# Patient Record
Sex: Female | Born: 2000 | Hispanic: No | Marital: Single | Smoking: Never smoker
Health system: Southern US, Community
[De-identification: ages and names within clinical notes are randomized; demographics above are authoritative.]

## PROBLEM LIST (undated history)

## (undated) DIAGNOSIS — F419 Anxiety disorder, unspecified: Secondary | ICD-10-CM

## (undated) DIAGNOSIS — L509 Urticaria, unspecified: Secondary | ICD-10-CM

## (undated) DIAGNOSIS — G589 Mononeuropathy, unspecified: Secondary | ICD-10-CM

## (undated) DIAGNOSIS — D649 Anemia, unspecified: Secondary | ICD-10-CM

## (undated) DIAGNOSIS — G43909 Migraine, unspecified, not intractable, without status migrainosus: Secondary | ICD-10-CM

## (undated) DIAGNOSIS — S149XXA Injury of unspecified nerves of neck, initial encounter: Secondary | ICD-10-CM

## (undated) DIAGNOSIS — J45909 Unspecified asthma, uncomplicated: Secondary | ICD-10-CM

## (undated) DIAGNOSIS — L309 Dermatitis, unspecified: Secondary | ICD-10-CM

## (undated) HISTORY — DX: Injury of unspecified nerves of neck, initial encounter: S14.9XXA

## (undated) HISTORY — DX: Urticaria, unspecified: L50.9

## (undated) HISTORY — DX: Migraine, unspecified, not intractable, without status migrainosus: G43.909

## (undated) HISTORY — DX: Dermatitis, unspecified: L30.9

## (undated) HISTORY — PX: TYMPANOSTOMY TUBE PLACEMENT: SHX32

## (undated) HISTORY — DX: Mononeuropathy, unspecified: G58.9

---
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
## 2021-03-27 LAB — OB RESULTS CONSOLE HEPATITIS B SURFACE ANTIGEN: Hepatitis B Surface Ag: NEGATIVE

## 2021-03-27 LAB — OB RESULTS CONSOLE ABO/RH: RH Type: POSITIVE

## 2021-03-27 LAB — HEPATITIS C ANTIBODY: HCV Ab: NEGATIVE

## 2021-03-27 LAB — OB RESULTS CONSOLE RUBELLA ANTIBODY, IGM: Rubella: IMMUNE

## 2021-03-27 LAB — OB RESULTS CONSOLE RPR: RPR: NONREACTIVE

## 2021-03-27 LAB — OB RESULTS CONSOLE HIV ANTIBODY (ROUTINE TESTING): HIV: NONREACTIVE

## 2021-05-15 ENCOUNTER — Emergency Department (HOSPITAL_BASED_OUTPATIENT_CLINIC_OR_DEPARTMENT_OTHER)
Admission: EM | Admit: 2021-05-15 | Discharge: 2021-05-15 | Disposition: A | Discharge status: Home / Self Care | Payer: Medicaid Other | Attending: Emergency Medicine | Admitting: Emergency Medicine

## 2021-05-15 ENCOUNTER — Other Ambulatory Visit: Payer: Self-pay

## 2021-05-15 ENCOUNTER — Encounter (HOSPITAL_BASED_OUTPATIENT_CLINIC_OR_DEPARTMENT_OTHER): Payer: Self-pay | Admitting: *Deleted

## 2021-05-15 DIAGNOSIS — O9A212 Injury, poisoning and certain other consequences of external causes complicating pregnancy, second trimester: Secondary | ICD-10-CM | POA: Diagnosis present

## 2021-05-15 DIAGNOSIS — S060X0A Concussion without loss of consciousness, initial encounter: Secondary | ICD-10-CM | POA: Diagnosis not present

## 2021-05-15 DIAGNOSIS — W208XXA Other cause of strike by thrown, projected or falling object, initial encounter: Secondary | ICD-10-CM | POA: Insufficient documentation

## 2021-05-15 DIAGNOSIS — Z5321 Procedure and treatment not carried out due to patient leaving prior to being seen by health care provider: Secondary | ICD-10-CM | POA: Diagnosis not present

## 2021-05-15 NOTE — ED Notes (Signed)
No answer for room x 1 

## 2021-05-15 NOTE — ED Notes (Signed)
Called for 3rd time; no answer; LWBS 

## 2021-05-15 NOTE — ED Triage Notes (Addendum)
C/o head injury from falling head board x 1 day ago. Denies LOC c/o h/a , Preg 18 weeks

## 2021-05-16 ENCOUNTER — Encounter: Payer: Self-pay | Admitting: Neurology

## 2021-07-22 NOTE — L&D Delivery Note (Signed)
Delivery Note ?Operative Delivery Note ?Patient noted to be 10/100/+2.  With pushing, deep repetitive decelerations occurred after each contraction.  Fetal heart rate baseline was also noted to increase from 140 to 170.  Patient was pushing with good effort.  She was consented for a vacuum delivery.  Fetal position was assessed.  Foley catheter was removed.  NICU team was alerted. Vacuum was applied at 1414.  Adequate suction could not be maintained.  New vacuum was requested and appropriate application was able to be achieved.   She pushed well over five contractions.  2 pop offs occurred but appropriate progress was made with each pull.  Vacuum was disengaged when fetal head was crowning.  The maternal tissue was slow to stretch, so patient was consented for a median episiotomy and baby delivered with next contraction.  At 1:35 PM a viable female was delivered via Vaginal, Vacuum Neurosurgeon).  Presentation:  Position: Occiput,, Posterior; Station: +3.  Patient immediately cried on maternal abdomen.  Delayed cord clamping and skin to skin was able to be performed ? ?Verbal consent: obtained from patient.  Risks and benefits discussed in detail.  Risks include, but are not limited to the risks of anesthesia, bleeding, infection, damage to maternal tissues, fetal cephalhematoma.  There is also the risk of inability to effect vaginal delivery of the head, or shoulder dystocia that cannot be resolved by established maneuvers, leading to the need for emergency cesarean section. ? ?APGAR: 9, 10; weight 3305 gm (7lb 4.6oz) .   ?Placenta status: spontaneous, in tact .   ?Cord: 3V with the following complications: None.  Cord pH: n/a ? ?Anesthesia:  Epidural, 1% lidocaine  ?Instruments: Kiwi Vacuum ?Episiotomy: Median ?Lacerations: Vaginal ?Suture Repair: 2.0 3.0 vicryl rapide ?Est. Blood Loss (mL): 200 ? ?Mom to postpartum.  Baby to Couplet care / Skin to Skin. ? ?Kesley Gaffey GEFFEL Londyn Hotard ?10/11/2021, 2:05 PM ? ? ? ? ? ?

## 2021-08-14 NOTE — Progress Notes (Signed)
NEUROLOGY CONSULTATION NOTE  Whitney Mcdonald MRN: [id number] DOB: 2001/02/06  Referring provider: Irene Pap, MD Primary care provider: Elwyn Reach, MD  Reason for consult:  migraines  Assessment/Plan:   Migraine without aura, without status migrainosus, not intractable Right sided occipital neuralgia History of multiple concussions Pregnant at [redacted] weeks gestation   1  She may start magnesium oxide 400mg  and riboflavin 400mg  daily.  She does plan to breastfeed, at which time we can start propranolol if needed. 2  For now, she may take acetaminophen if needed (Limit use of pain relievers to no more than 2 days out of week to prevent risk of rebound or medication-overuse headache).  Once her baby is born, we can try sumatriptan but she will need to hold nursing for 8 to 12 hours. 3.  Headache diary 4.  Follow up in 3 months.   Subjective:  Whitney Mcdonald is a 21 year old female who presents for migraines.  History supplemented by prior neurologist's and referring provider's notes.  History of migraines since 16 or 21 years old.  At that time, they were frequent.  They have improved since then.  They are a 7/10 throbbing/pounding to pressure pain in band like distribution or unilateral.  Sometimes associated with a shooting pain radiating up from the right occipital region.  Associated photophobia, phonophobia, visual disturbance (black spots) and dizziness but denies nausea, vomiting, unilateral numbness or weakness.  They typically last 2 hours but may last all day.  They occur 2 to 3 times a week.  If she sleeps in an awkward position, causing neck strain, she may have a severe migraine that following day.  Sleep helps relieve them.  Usually responds to Excedrin Migraine.  Previously on propranolol which was helpful.    She is currently [redacted] weeks pregnant.  No change in migraines.  Not taking any analgesics.    She has history of multiple head injuries since childhood.   She briefly lost consciousness twice.  Usually due to being clumsy (running backwards and falling, falling off a 7 foot wall).  On one occasion she had sustained some mild intracranial bleeding requiring no intervention.  She most recently had a concussion in October 2022 when a bed frame leaning against the wall fell on her head.  No LOC.  She was dizzy for a couple of days.   CT head on 08/09/2017 personally reviewed was normal.   Current NSAIDS/analgesics:  asa 81mg  Current triptans:  none Current ergotamine:  none Current anti-emetic:  none Current muscle relaxants:  none Current Antihypertensive medications:  none Current Antidepressant medications:  none Current Anticonvulsant medications:  none Current anti-CGRP:  none Current Vitamins/Herbal/Supplements:  prenatal Current Antihistamines/Decongestants:  Flonase Other therapy:  none Hormone/birth control:  none   Past NSAIDS/analgesics:  Excedrin Migraine Past abortive triptans:  none Past abortive ergotamine:  none Past muscle relaxants:  cyclobenzaprine Past anti-emetic:  none Past antihypertensive medications:  propranlol Past antidepressant medications:  none Past anticonvulsant medications:  gabapentin Past anti-CGRP:  none Past vitamins/Herbal/Supplements:  none Past antihistamines/decongestants:  Claritin, Flonase Other past therapies:  none  Family history:  Mom has migraines.   PAST MEDICAL HISTORY: Past Medical History:  Diagnosis Date   Eczema    Migraine    Pinched nerve in neck    occiputical   Urticaria     PAST SURGICAL HISTORY: Past Surgical History:  Procedure Laterality Date   TYMPANOSTOMY TUBE PLACEMENT      MEDICATIONS: Current Outpatient  Medications on File Prior to Visit  Medication Sig Dispense Refill   albuterol (PROAIR HFA) 108 (90 Base) MCG/ACT inhaler Inhale 2 puffs every 4 (four) hours as needed into the lungs for wheezing or shortness of breath. 1 Inhaler 2   cyclobenzaprine  (FLEXERIL) 5 MG tablet Take 1 tablet (5 mg total) by mouth every 8 (eight) hours as needed for muscle spasms. 30 tablet 5   EPINEPHrine (EPIPEN 2-PAK) 0.3 mg/0.3 mL IJ SOAJ injection Use as directed for severe allergic reaction 2 Device 1   fluticasone (FLONASE) 50 MCG/ACT nasal spray 2 sprays per nostril once a day if needed 16 g 5   gabapentin (NEURONTIN) 100 MG capsule Take 1 capsule (100 mg total) by mouth at bedtime. 30 capsule 5   levocetirizine (XYZAL) 5 MG tablet Take 1 tablet (5 mg total) by mouth every evening. 34 tablet 5   montelukast (SINGULAIR) 10 MG tablet TAKE 1 TABLET (10 MG TOTAL) AT BEDTIME BY MOUTH. 30 tablet 0   olopatadine (PATANOL) 0.1 % ophthalmic solution Place 1 drop into both eyes 2 (two) times daily. 5 mL 5   No current facility-administered medications on file prior to visit.    ALLERGIES: Allergies  Allergen Reactions   Cherry     Mouth itching   Strawberry Extract     FAMILY HISTORY: Family History  Problem Relation Age of Onset   Allergic rhinitis Mother    Asthma Mother    Sinusitis Mother    Depression Mother    Bipolar disorder Mother    Eczema Neg Hx    Immunodeficiency Neg Hx    Urticaria Neg Hx    Migraines Neg Hx    Seizures Neg Hx    Anxiety disorder Neg Hx    Schizophrenia Neg Hx    ADD / ADHD Neg Hx    Autism Neg Hx     Objective:  Blood pressure 103/64, pulse 84, weight 105 lb (47.6 kg), last menstrual period 01/06/2021, SpO2 100 %. General: No acute distress.  Patient appears well-groomed.   Head:  Normocephalic/atraumatic Eyes:  fundi examined but not visualized Neck: supple, no paraspinal tenderness, full range of motion Back: No paraspinal tenderness Heart: regular rate and rhythm Lungs: Clear to auscultation bilaterally. Vascular: No carotid bruits. Neurological Exam: Mental status: alert and oriented to person, place, and time, recent and remote memory intact, fund of knowledge intact, attention and concentration  intact, speech fluent and not dysarthric, language intact. Cranial nerves: CN I: not tested CN II: pupils equal, round and reactive to light, visual fields intact CN III, IV, VI:  full range of motion, no nystagmus, no ptosis CN V: facial sensation intact. CN VII: upper and lower face symmetric CN VIII: hearing intact CN IX, X: gag intact, uvula midline CN XI: sternocleidomastoid and trapezius muscles intact CN XII: tongue midline Bulk & Tone: normal, no fasciculations. Motor:  muscle strength 5/5 throughout Sensation:  Pinprick, temperature and vibratory sensation intact. Deep Tendon Reflexes:  2+ throughout,  toes downgoing.   Finger to nose testing:  Without dysmetria.   Heel to shin:  Without dysmetria.   Gait:  Normal station and stride.  Romberg negative.    Thank you for allowing me to take part in the care of this patient.  Metta Clines, DO  CC:  Irene Pap, MD  Elwyn Reach, MD

## 2021-08-15 ENCOUNTER — Encounter: Payer: Self-pay | Admitting: Neurology

## 2021-08-15 ENCOUNTER — Other Ambulatory Visit: Payer: Self-pay

## 2021-08-15 ENCOUNTER — Ambulatory Visit (INDEPENDENT_AMBULATORY_CARE_PROVIDER_SITE_OTHER): Payer: Medicaid Other | Admitting: Neurology

## 2021-08-15 VITALS — BP 103/64 | HR 84 | Wt 105.0 lb

## 2021-08-15 DIAGNOSIS — G43009 Migraine without aura, not intractable, without status migrainosus: Secondary | ICD-10-CM | POA: Diagnosis not present

## 2021-08-15 DIAGNOSIS — Z87820 Personal history of traumatic brain injury: Secondary | ICD-10-CM

## 2021-08-15 DIAGNOSIS — Z3A31 31 weeks gestation of pregnancy: Secondary | ICD-10-CM

## 2021-08-15 DIAGNOSIS — M5481 Occipital neuralgia: Secondary | ICD-10-CM | POA: Diagnosis not present

## 2021-08-15 NOTE — Patient Instructions (Signed)
Start magnesium oxide 400mg  daily and riboflavin (B2) 400mg  daily May take acetaminophen.  Limit use of pain relievers to no more than 2 days out of week to prevent risk of rebound or medication-overuse headache. Follow up in 3 months.

## 2021-09-10 ENCOUNTER — Encounter (HOSPITAL_COMMUNITY): Payer: Self-pay | Admitting: Obstetrics and Gynecology

## 2021-09-10 ENCOUNTER — Inpatient Hospital Stay (HOSPITAL_COMMUNITY)
Admission: AD | Admit: 2021-09-10 | Discharge: 2021-09-10 | Disposition: A | Discharge status: Home / Self Care | Payer: Medicaid Other | Attending: Obstetrics and Gynecology | Admitting: Obstetrics and Gynecology

## 2021-09-10 ENCOUNTER — Other Ambulatory Visit: Payer: Self-pay

## 2021-09-10 DIAGNOSIS — Z3A35 35 weeks gestation of pregnancy: Secondary | ICD-10-CM | POA: Diagnosis not present

## 2021-09-10 DIAGNOSIS — O4703 False labor before 37 completed weeks of gestation, third trimester: Secondary | ICD-10-CM | POA: Diagnosis not present

## 2021-09-10 DIAGNOSIS — O479 False labor, unspecified: Secondary | ICD-10-CM | POA: Diagnosis not present

## 2021-09-10 HISTORY — DX: Anemia, unspecified: D64.9

## 2021-09-10 HISTORY — DX: Anxiety disorder, unspecified: F41.9

## 2021-09-10 HISTORY — DX: Unspecified asthma, uncomplicated: J45.909

## 2021-09-10 LAB — URINALYSIS, ROUTINE W REFLEX MICROSCOPIC
Bilirubin Urine: NEGATIVE
Glucose, UA: NEGATIVE mg/dL
Hgb urine dipstick: NEGATIVE
Ketones, ur: NEGATIVE mg/dL
Nitrite: NEGATIVE
Protein, ur: NEGATIVE mg/dL
Specific Gravity, Urine: 1.006 (ref 1.005–1.030)
pH: 7 (ref 5.0–8.0)

## 2021-09-10 NOTE — MAU Note (Signed)
Whitney Mcdonald is a 21 y.o. at [redacted]w[redacted]d here in MAU reporting: has been having contractions for the past 12 hours. They occur about every 4 minutes. No LOF. No bleeding. +FM.  Onset of complaint: 12 hours  Pain score: 6/10  Vitals:   09/10/21 1121  BP: 115/76  Pulse: 84  Resp: 16  Temp: 97.8 F (36.6 C)  SpO2: 97%     FHT:137  Lab orders placed from triage: UA

## 2021-09-10 NOTE — MAU Provider Note (Signed)
History     CSN: [id number]  Arrival date and time: 09/10/21 1109   Event Date/Time   First Provider Initiated Contact with Patient 09/10/21 1204      Chief Complaint  Patient presents with   Contractions   HPI  Whitney Mcdonald. Is a 21 y.o. female G1P0 @ [redacted]w[redacted]d here in MAU with complaints of contractions. The contractions started 12 hours ago following intercourse. The contractions come and go and are irregular. She has no bleeding or leaking of fluid. + FM.   OB History     Gravida  1   Para      Term      Preterm      AB      Living         SAB      IAB      Ectopic      Multiple      Live Births              Past Medical History:  Diagnosis Date   Anemia    Anxiety    Asthma    exercise induced, pretty much stopped when stopped sports   Eczema    Migraine    Pinched nerve in neck    occiputical   Urticaria     Past Surgical History:  Procedure Laterality Date   TYMPANOSTOMY TUBE PLACEMENT      Family History  Problem Relation Age of Onset   Diabetes Mother    Heart disease Mother        murmur   Allergic rhinitis Mother    Asthma Mother    Sinusitis Mother    Depression Mother    Bipolar disorder Mother    Healthy Father    Eczema Neg Hx    Immunodeficiency Neg Hx    Urticaria Neg Hx    Migraines Neg Hx    Seizures Neg Hx    Anxiety disorder Neg Hx    Schizophrenia Neg Hx    ADD / ADHD Neg Hx    Autism Neg Hx     Social History   Tobacco Use   Smoking status: Never   Smokeless tobacco: Never   Tobacco comments:    E cig stopped 7/22  Vaping Use   Vaping Use: Former  Substance Use Topics   Alcohol use: No   Drug use: No    Allergies:  Allergies  Allergen Reactions   Cherry     Mouth itching   Strawberry Extract     Used to be a rash, rec'd allergy shots    Medications Prior to Admission  Medication Sig Dispense Refill Last Dose   BABY ASPIRIN PO Take by mouth.   Past Week   Prenatal Vit-Fe  Fumarate-FA (PRENATAL COMPLETE PO) Take by mouth.   09/09/2021   albuterol (PROAIR HFA) 108 (90 Base) MCG/ACT inhaler Inhale 2 puffs every 4 (four) hours as needed into the lungs for wheezing or shortness of breath. (Patient not taking: Reported on 08/15/2021) 1 Inhaler 2    EPINEPHrine (EPIPEN 2-PAK) 0.3 mg/0.3 mL IJ SOAJ injection Use as directed for severe allergic reaction 2 Device 1    fluticasone (FLONASE) 50 MCG/ACT nasal spray 2 sprays per nostril once a day if needed 16 g 5    montelukast (SINGULAIR) 10 MG tablet TAKE 1 TABLET (10 MG TOTAL) AT BEDTIME BY MOUTH. 30 tablet 0    Results for orders placed or performed during the hospital  encounter of 09/10/21 (from the past 48 hour(s))  Urinalysis, Routine w reflex microscopic Urine, Clean Catch     Status: Abnormal   Collection Time: 09/10/21 11:15 AM  Result Value Ref Range   Color, Urine YELLOW YELLOW   APPearance HAZY (A) CLEAR   Specific Gravity, Urine 1.006 1.005 - 1.030   pH 7.0 5.0 - 8.0   Glucose, UA NEGATIVE NEGATIVE mg/dL   Hgb urine dipstick NEGATIVE NEGATIVE   Bilirubin Urine NEGATIVE NEGATIVE   Ketones, ur NEGATIVE NEGATIVE mg/dL   Protein, ur NEGATIVE NEGATIVE mg/dL   Nitrite NEGATIVE NEGATIVE   Leukocytes,Ua LARGE (A) NEGATIVE   RBC / HPF 6-10 0 - 5 RBC/hpf   WBC, UA 11-20 0 - 5 WBC/hpf   Bacteria, UA RARE (A) NONE SEEN   Squamous Epithelial / LPF 11-20 0 - 5   Mucus PRESENT     Comment: Performed at [facility], [address], [place], [place] [address]     Review of Systems  Constitutional:  Negative for fever.  Gastrointestinal:  Positive for abdominal pain.  Genitourinary:  Negative for dysuria, flank pain, vaginal bleeding and vaginal discharge.  Physical Exam   Blood pressure 115/76, pulse 84, temperature 97.8 F (36.6 C), temperature source Oral, resp. rate 16, height 5\' 2"  (1.575 m), weight 49.4 kg, last menstrual period 01/06/2021, SpO2 97 %.  Physical Exam Vitals and nursing note reviewed.   Constitutional:      Appearance: Normal appearance.  HENT:     Head: Normocephalic.  Abdominal:     Palpations: Abdomen is soft.     Tenderness: There is no abdominal tenderness.  Genitourinary:    Comments: Dilation: Closed Effacement (%): 50 Station: -3 Exam by:: J Yoshino Broccoli NP  Skin:    General: Skin is warm.  Neurological:     Mental Status: She is alert and oriented to person, place, and time.  Psychiatric:        Mood and Affect: Mood normal.   Fetal Tracing: Baseline: 125 bpm Variability: Moderate  Accelerations: 15x15 Decelerations: None Toco:  UI  MAU Course  Procedures None  MDM  Oral hydration in MAU. Patient feeling much better.   Assessment and Plan   A:  1. Braxton Hicks contractions   2. [redacted] weeks gestation of pregnancy      P:  Discharge home Return to MAU if symptoms worsen Labor precautions  Esias Mory, [phone], NP 09/10/2021 5:23 PM

## 2021-09-26 LAB — OB RESULTS CONSOLE GC/CHLAMYDIA
Chlamydia: NEGATIVE
Gonorrhea: NEGATIVE

## 2021-09-26 LAB — OB RESULTS CONSOLE GBS: GBS: POSITIVE

## 2021-10-08 ENCOUNTER — Other Ambulatory Visit: Payer: Self-pay

## 2021-10-08 ENCOUNTER — Inpatient Hospital Stay (HOSPITAL_COMMUNITY)
Admission: AD | Admit: 2021-10-08 | Discharge: 2021-10-08 | Disposition: A | Discharge status: Home / Self Care | Payer: Medicaid Other | Attending: Obstetrics and Gynecology | Admitting: Obstetrics and Gynecology

## 2021-10-08 ENCOUNTER — Encounter (HOSPITAL_COMMUNITY): Payer: Self-pay | Admitting: Obstetrics and Gynecology

## 2021-10-08 DIAGNOSIS — O368130 Decreased fetal movements, third trimester, not applicable or unspecified: Secondary | ICD-10-CM | POA: Diagnosis present

## 2021-10-08 DIAGNOSIS — O471 False labor at or after 37 completed weeks of gestation: Secondary | ICD-10-CM | POA: Insufficient documentation

## 2021-10-08 DIAGNOSIS — Z3A39 39 weeks gestation of pregnancy: Secondary | ICD-10-CM | POA: Diagnosis not present

## 2021-10-08 NOTE — MAU Note (Signed)
Whitney Mcdonald is a 21 y.o. at [redacted]w[redacted]d here in MAU reporting: called her OB, mucous plug came out yesterday, more today. Has been feeling decreased fetal movement since the plug came out yesterday. Has been cramping, a "bad one every 30 min to an hour" ? ?Onset of complaint: yesterday ?Pain score: 7 ?Vitals:  ? 10/08/21 1804  ?BP: 120/60  ?Pulse: (!) 104  ?Resp: 16  ?Temp: 98.3 ?F (36.8 ?C)  ?SpO2: 100%  ?   ?FHT:158, movement seen by nurse and pt while listening in triage ?Lab orders placed from triage:  none ?

## 2021-10-08 NOTE — MAU Provider Note (Signed)
?History  ?  ? ?CSN: [id number] ? ?Arrival date and time: 10/08/21 1744 ? ? Event Date/Time  ? First Provider Initiated Contact with Patient 10/08/21 1833   ?  ? ?Chief Complaint  ?Patient presents with  ? Decreased Fetal Movement  ? Contractions  ? ?HPI ?This is a 21 year old G1 at 39 weeks and 2 days who presents with occasional contractions that started yesterday and has continued today.  She reports losing her mucous plug yesterday and since then has had some decreased fetal movement.  She is still feeling the baby move although the movements have not been as much.  Denies vaginal bleeding, leaking fluid. ? ?OB History   ? ? Gravida  ?1  ? Para  ?   ? Term  ?   ? Preterm  ?   ? AB  ?   ? Living  ?   ?  ? ? SAB  ?   ? IAB  ?   ? Ectopic  ?   ? Multiple  ?   ? Live Births  ?   ?   ?  ?  ? ? ?Past Medical History:  ?Diagnosis Date  ? Anemia   ? Anxiety   ? Asthma   ? exercise induced, pretty much stopped when stopped sports  ? Eczema   ? Migraine   ? Pinched nerve in neck   ? occiputical  ? Urticaria   ? ? ?Past Surgical History:  ?Procedure Laterality Date  ? TYMPANOSTOMY TUBE PLACEMENT    ? ? ?Family History  ?Problem Relation Age of Onset  ? Diabetes Mother   ? Heart disease Mother   ?     murmur  ? Allergic rhinitis Mother   ? Asthma Mother   ? Sinusitis Mother   ? Depression Mother   ? Bipolar disorder Mother   ? Healthy Father   ? Eczema Neg Hx   ? Immunodeficiency Neg Hx   ? Urticaria Neg Hx   ? Migraines Neg Hx   ? Seizures Neg Hx   ? Anxiety disorder Neg Hx   ? Schizophrenia Neg Hx   ? ADD / ADHD Neg Hx   ? Autism Neg Hx   ? ? ?Social History  ? ?Tobacco Use  ? Smoking status: Never  ? Smokeless tobacco: Never  ? Tobacco comments:  ?  E cig stopped 7/22  ?Vaping Use  ? Vaping Use: Never used  ?Substance Use Topics  ? Alcohol use: No  ? Drug use: No  ? ? ?Allergies:  ?Allergies  ?Allergen Reactions  ? Cherry   ?  Mouth itching  ? Strawberry Extract   ?  Used to be a rash, rec'd allergy shots   ? ? ?Medications Prior to Admission  ?Medication Sig Dispense Refill Last Dose  ? BABY ASPIRIN PO Take by mouth.   10/07/2021  ? Prenatal Vit-Fe Fumarate-FA (PRENATAL COMPLETE PO) Take by mouth.   10/08/2021  ? albuterol (PROAIR HFA) 108 (90 Base) MCG/ACT inhaler Inhale 2 puffs every 4 (four) hours as needed into the lungs for wheezing or shortness of breath. (Patient not taking: Reported on 08/15/2021) 1 Inhaler 2   ? EPINEPHrine (EPIPEN 2-PAK) 0.3 mg/0.3 mL IJ SOAJ injection Use as directed for severe allergic reaction 2 Device 1 More than a month  ? fluticasone (FLONASE) 50 MCG/ACT nasal spray 2 sprays per nostril once a day if needed 16 g 5 More than a month  ? montelukast (SINGULAIR) 10  MG tablet TAKE 1 TABLET (10 MG TOTAL) AT BEDTIME BY MOUTH. 30 tablet 0 More than a month  ? ? ?Review of Systems ?Physical Exam  ? ?Blood pressure 120/60, pulse (!) 104, temperature 98.3 ?F (36.8 ?C), temperature source Oral, resp. rate 16, height 5\' 2"  (1.575 m), weight 50.6 kg, last menstrual period 01/06/2021, SpO2 100 %. ? ?Physical Exam ?Vitals reviewed.  ?Constitutional:   ?   Appearance: Normal appearance.  ?Abdominal:  ?   General: Abdomen is flat.  ?   Palpations: Abdomen is soft.  ?Neurological:  ?   Mental Status: She is alert.  ?Psychiatric:     ?   Mood and Affect: Mood normal.     ?   Behavior: Behavior normal.     ?   Thought Content: Thought content normal.     ?   Judgment: Judgment normal.  ? ? ?MAU Course  ?Procedures ?NST:  ?Baseline: 130  ?Variability: moderate ?Accelerations: ++   ?Decelerations: none ?Contractions: occasional ? ?MDM ? ? ?Assessment and Plan  ?1. [redacted] weeks gestation of pregnancy ? ?2. Decreased fetal movements in third trimester, single or unspecified fetus ?Since the patient has been here, the patient has experienced several fetal movements.  Strip is category 1 and reactive.  Patient discussed with Dr. 01/08/2021 who will get the patient into the office tomorrow for reevaluation. ? ? ?Mindi Slicker ?10/08/2021, 7:53 PM  ?

## 2021-10-10 ENCOUNTER — Inpatient Hospital Stay (EMERGENCY_DEPARTMENT_HOSPITAL)
Admission: AD | Admit: 2021-10-10 | Discharge: 2021-10-10 | Disposition: A | Discharge status: Home / Self Care | Payer: Medicaid Other | Source: Home / Self Care | Attending: Obstetrics and Gynecology | Admitting: Obstetrics and Gynecology

## 2021-10-10 ENCOUNTER — Telehealth (HOSPITAL_COMMUNITY): Payer: Self-pay | Admitting: *Deleted

## 2021-10-10 ENCOUNTER — Encounter (HOSPITAL_COMMUNITY): Payer: Self-pay | Admitting: Obstetrics and Gynecology

## 2021-10-10 DIAGNOSIS — Z3A39 39 weeks gestation of pregnancy: Secondary | ICD-10-CM | POA: Insufficient documentation

## 2021-10-10 DIAGNOSIS — O471 False labor at or after 37 completed weeks of gestation: Secondary | ICD-10-CM | POA: Insufficient documentation

## 2021-10-10 DIAGNOSIS — Z3689 Encounter for other specified antenatal screening: Secondary | ICD-10-CM

## 2021-10-10 DIAGNOSIS — O26893 Other specified pregnancy related conditions, third trimester: Secondary | ICD-10-CM | POA: Insufficient documentation

## 2021-10-10 DIAGNOSIS — O479 False labor, unspecified: Secondary | ICD-10-CM | POA: Diagnosis not present

## 2021-10-10 NOTE — MAU Note (Signed)
Whitney Mcdonald is a 21 y.o. at [redacted]w[redacted]d here in MAU reporting: contractions started last night, was able to get a few hours of sleep, now every .  Was 2/70 yesterday- had membrane sweep. No leaking, small amt of bleeding after sweep.  Reports +FM ? ?Onset of complaint: 2000 ?Pain score: 9 ?Vitals:  ? 10/10/21 0920  ?BP: 114/70  ?Pulse: 77  ?Resp: 16  ?Temp: 97.9 ?F (36.6 ?C)  ?SpO2: 100%  ?   ?Lab orders placed from triage:  none ?

## 2021-10-10 NOTE — Telephone Encounter (Signed)
Preadmission screen  

## 2021-10-10 NOTE — MAU Provider Note (Signed)
S: Patient is here for RN labor evaluation. Fetal tracing, vital signs, & chart reviewed  ? ?O:  ?Vitals:  ? 10/10/21 1105 10/10/21 1110 10/10/21 1115 10/10/21 1117  ?BP:    128/75  ?Pulse:    70  ?Resp:    16  ?Temp:    97.8 ?F (36.6 ?C)  ?TempSrc:    Oral  ?SpO2: 99% 100% 99%   ?Weight:      ?Height:      ? ?No results found for this or any previous visit (from the past 24 hour(s)). ? ?Dilation: 2 ?Effacement (%): 80 ?Cervical Position: Posterior ?Station: -1 ?Presentation: Vertex ?Exam by:: E.Edwards,RN ? ? ?FHR: 120 bpm, Mod Var, No Decels, 15 x 15 Accels ?UC: q 1-5 min ? ? ?A: ?--21 y.o. G1P0 at [redacted]w[redacted]d  for RN labor check ?--Reactive NST ?--Cervix unchanged after 90 min in MAU ? ? ?P:  ?RN to discharge home in stable condition with return precautions & fetal kick counts ? ?Darlina Rumpf, CNM ?10/10/21 ?1:29 PM ? ?

## 2021-10-11 ENCOUNTER — Inpatient Hospital Stay (HOSPITAL_COMMUNITY)
Admission: AD | Admit: 2021-10-11 | Discharge: 2021-10-12 | DRG: 807 | Disposition: A | Discharge status: Home / Self Care | Payer: Medicaid Other | Attending: Obstetrics | Admitting: Obstetrics

## 2021-10-11 ENCOUNTER — Other Ambulatory Visit: Payer: Self-pay

## 2021-10-11 ENCOUNTER — Inpatient Hospital Stay (HOSPITAL_COMMUNITY): Payer: Medicaid Other | Admitting: Anesthesiology

## 2021-10-11 ENCOUNTER — Encounter (HOSPITAL_COMMUNITY): Payer: Self-pay | Admitting: Obstetrics and Gynecology

## 2021-10-11 DIAGNOSIS — O99824 Streptococcus B carrier state complicating childbirth: Principal | ICD-10-CM | POA: Diagnosis present

## 2021-10-11 DIAGNOSIS — Z3A39 39 weeks gestation of pregnancy: Secondary | ICD-10-CM

## 2021-10-11 DIAGNOSIS — O26893 Other specified pregnancy related conditions, third trimester: Secondary | ICD-10-CM | POA: Diagnosis present

## 2021-10-11 LAB — CBC
HCT: 38 % (ref 36.0–46.0)
Hemoglobin: 13.1 g/dL (ref 12.0–15.0)
MCH: 29.7 pg (ref 26.0–34.0)
MCHC: 34.5 g/dL (ref 30.0–36.0)
MCV: 86.2 fL (ref 80.0–100.0)
Platelets: 347 10*3/uL (ref 150–400)
RBC: 4.41 MIL/uL (ref 3.87–5.11)
RDW: 12.7 % (ref 11.5–15.5)
WBC: 15.8 10*3/uL — ABNORMAL HIGH (ref 4.0–10.5)
nRBC: 0 % (ref 0.0–0.2)

## 2021-10-11 LAB — TYPE AND SCREEN
ABO/RH(D): A POS
Antibody Screen: NEGATIVE

## 2021-10-11 LAB — RPR: RPR Ser Ql: NONREACTIVE

## 2021-10-11 MED ORDER — EPHEDRINE 5 MG/ML INJ: 10.0000 mg | INTRAVENOUS | Status: DC | PRN

## 2021-10-11 MED ORDER — LACTATED RINGERS IV SOLN
500.0000 mL | Freq: Once | INTRAVENOUS | Status: AC
  Administered 2021-10-11: 500 mL via INTRAVENOUS

## 2021-10-11 MED ORDER — ACETAMINOPHEN 325 MG PO TABS: 650.0000 mg | ORAL_TABLET | ORAL | Status: DC | PRN

## 2021-10-11 MED ORDER — FENTANYL-BUPIVACAINE-NACL 0.5-0.125-0.9 MG/250ML-% EP SOLN
12.0000 mL/h | EPIDURAL | Status: DC | PRN
  Administered 2021-10-11: 12 mL/h via EPIDURAL
  Filled 2021-10-11: qty 250

## 2021-10-11 MED ORDER — ERYTHROMYCIN 5 MG/GM OP OINT
TOPICAL_OINTMENT | OPHTHALMIC | Status: AC
  Filled 2021-10-11: qty 1

## 2021-10-11 MED ORDER — LACTATED RINGERS IV SOLN: 500.0000 mL | INTRAVENOUS | Status: DC | PRN

## 2021-10-11 MED ORDER — ONDANSETRON HCL 4 MG/2ML IJ SOLN: 4.0000 mg | Freq: Four times a day (QID) | INTRAMUSCULAR | Status: DC | PRN

## 2021-10-11 MED ORDER — COCONUT OIL OIL: 1.0000 "application " | TOPICAL_OIL | Status: DC | PRN

## 2021-10-11 MED ORDER — BENZOCAINE-MENTHOL 20-0.5 % EX AERO
1.0000 "application " | INHALATION_SPRAY | CUTANEOUS | Status: DC | PRN
  Administered 2021-10-11: 1 via TOPICAL
  Filled 2021-10-11: qty 56

## 2021-10-11 MED ORDER — ONDANSETRON HCL 4 MG/2ML IJ SOLN: 4.0000 mg | INTRAMUSCULAR | Status: DC | PRN

## 2021-10-11 MED ORDER — SODIUM CHLORIDE 0.9 % IV SOLN
1.0000 g | INTRAVENOUS | Status: DC
  Administered 2021-10-11: 1 g via INTRAVENOUS
  Filled 2021-10-11: qty 1000

## 2021-10-11 MED ORDER — LIDOCAINE HCL (PF) 1 % IJ SOLN
INTRAMUSCULAR | Status: DC | PRN
  Administered 2021-10-11 (×2): 4 mL via EPIDURAL

## 2021-10-11 MED ORDER — ONDANSETRON HCL 4 MG PO TABS: 4.0000 mg | ORAL_TABLET | ORAL | Status: DC | PRN

## 2021-10-11 MED ORDER — SENNOSIDES-DOCUSATE SODIUM 8.6-50 MG PO TABS
2.0000 | ORAL_TABLET | ORAL | Status: DC
  Administered 2021-10-11 – 2021-10-12 (×2): 2 via ORAL
  Filled 2021-10-11 (×2): qty 2

## 2021-10-11 MED ORDER — SIMETHICONE 80 MG PO CHEW: 80.0000 mg | CHEWABLE_TABLET | ORAL | Status: DC | PRN

## 2021-10-11 MED ORDER — OXYTOCIN-SODIUM CHLORIDE 30-0.9 UT/500ML-% IV SOLN
2.5000 [IU]/h | INTRAVENOUS | Status: DC
  Filled 2021-10-11: qty 500

## 2021-10-11 MED ORDER — FLEET ENEMA 7-19 GM/118ML RE ENEM: 1.0000 | ENEMA | RECTAL | Status: DC | PRN

## 2021-10-11 MED ORDER — OXYCODONE-ACETAMINOPHEN 5-325 MG PO TABS: 1.0000 | ORAL_TABLET | ORAL | Status: DC | PRN

## 2021-10-11 MED ORDER — LIDOCAINE HCL (PF) 1 % IJ SOLN
30.0000 mL | INTRAMUSCULAR | Status: AC | PRN
  Administered 2021-10-11: 30 mL via SUBCUTANEOUS
  Filled 2021-10-11: qty 30

## 2021-10-11 MED ORDER — DIPHENHYDRAMINE HCL 25 MG PO CAPS: 25.0000 mg | ORAL_CAPSULE | Freq: Four times a day (QID) | ORAL | Status: DC | PRN

## 2021-10-11 MED ORDER — OXYCODONE-ACETAMINOPHEN 5-325 MG PO TABS: 2.0000 | ORAL_TABLET | ORAL | Status: DC | PRN

## 2021-10-11 MED ORDER — LACTATED RINGERS IV SOLN: INTRAVENOUS | Status: DC

## 2021-10-11 MED ORDER — TERBUTALINE SULFATE 1 MG/ML IJ SOLN: 0.2500 mg | Freq: Once | INTRAMUSCULAR | Status: DC | PRN

## 2021-10-11 MED ORDER — SODIUM CHLORIDE 0.9 % IV SOLN
2.0000 g | Freq: Once | INTRAVENOUS | Status: AC
  Administered 2021-10-11: 2 g via INTRAVENOUS
  Filled 2021-10-11: qty 2000

## 2021-10-11 MED ORDER — IBUPROFEN 600 MG PO TABS
600.0000 mg | ORAL_TABLET | Freq: Four times a day (QID) | ORAL | Status: DC
  Administered 2021-10-11 – 2021-10-12 (×5): 600 mg via ORAL
  Filled 2021-10-11 (×5): qty 1

## 2021-10-11 MED ORDER — TETANUS-DIPHTH-ACELL PERTUSSIS 5-2.5-18.5 LF-MCG/0.5 IM SUSY: 0.5000 mL | PREFILLED_SYRINGE | Freq: Once | INTRAMUSCULAR | Status: DC

## 2021-10-11 MED ORDER — DIBUCAINE (PERIANAL) 1 % EX OINT: 1.0000 "application " | TOPICAL_OINTMENT | CUTANEOUS | Status: DC | PRN

## 2021-10-11 MED ORDER — PRENATAL MULTIVITAMIN CH
1.0000 | ORAL_TABLET | Freq: Every day | ORAL | Status: DC
  Administered 2021-10-12: 1 via ORAL
  Filled 2021-10-11: qty 1

## 2021-10-11 MED ORDER — WITCH HAZEL-GLYCERIN EX PADS: 1.0000 "application " | MEDICATED_PAD | CUTANEOUS | Status: DC | PRN

## 2021-10-11 MED ORDER — OXYCODONE HCL 5 MG PO TABS: 5.0000 mg | ORAL_TABLET | ORAL | Status: DC | PRN

## 2021-10-11 MED ORDER — OXYTOCIN-SODIUM CHLORIDE 30-0.9 UT/500ML-% IV SOLN
1.0000 m[IU]/min | INTRAVENOUS | Status: DC
  Administered 2021-10-11: 2 m[IU]/min via INTRAVENOUS

## 2021-10-11 MED ORDER — OXYCODONE HCL 5 MG PO TABS: 10.0000 mg | ORAL_TABLET | ORAL | Status: DC | PRN

## 2021-10-11 MED ORDER — BUTORPHANOL TARTRATE 1 MG/ML IJ SOLN: 1.0000 mg | INTRAMUSCULAR | Status: DC | PRN

## 2021-10-11 MED ORDER — DIPHENHYDRAMINE HCL 50 MG/ML IJ SOLN: 12.5000 mg | INTRAMUSCULAR | Status: DC | PRN

## 2021-10-11 MED ORDER — OXYTOCIN BOLUS FROM INFUSION
333.0000 mL | Freq: Once | INTRAVENOUS | Status: AC
  Administered 2021-10-11: 333 mL via INTRAVENOUS

## 2021-10-11 MED ORDER — PHENYLEPHRINE 40 MCG/ML (10ML) SYRINGE FOR IV PUSH (FOR BLOOD PRESSURE SUPPORT): 80.0000 ug | PREFILLED_SYRINGE | INTRAVENOUS | Status: DC | PRN

## 2021-10-11 MED ORDER — SOD CITRATE-CITRIC ACID 500-334 MG/5ML PO SOLN
30.0000 mL | ORAL | Status: DC | PRN
  Filled 2021-10-11: qty 30

## 2021-10-11 NOTE — Lactation Note (Signed)
This note was copied from a baby's chart. ?Lactation Consultation Note ? ?Patient Name: Whitney Mcdonald ?Today's Date: 10/11/2021 ?Reason for consult: Initial assessment;Mother's request;Primapara;1st time breastfeeding;Term;Breastfeeding assistance ?Age:20 hours ?LC assisted with latching with signs of milk transfer. Infant still feeding at the end of the visit.  ?Plan 1. To feed based on cues 8-12x 24 period. Mom to offer breasts and look for signs of milk transfer.  ?2. Mom to offer EBM via spoon 5-7 ml per feeding if unable to latch  ?3. I and O sheet reviewed.  ?All questions answered at the end of the visit.  ?Maternal Data ?Has patient been taught Hand Expression?: Yes ?Does the patient have breastfeeding experience prior to this delivery?: No ? ?Feeding ?Mother's Current Feeding Choice: Breast Milk ? ?LATCH Score ?Latch: Repeated attempts needed to sustain latch, nipple held in mouth throughout feeding, stimulation needed to elicit sucking reflex. ? ?Audible Swallowing: Spontaneous and intermittent ? ?Type of Nipple: Everted at rest and after stimulation ? ?Comfort (Breast/Nipple): Soft / non-tender ? ?Hold (Positioning): Assistance needed to correctly position infant at breast and maintain latch. ? ?LATCH Score: 8 ? ? ?Lactation Tools Discussed/Used ?  ? ?Interventions ?Interventions: Breast feeding basics reviewed;Assisted with latch;Skin to skin;Breast massage;Hand express;Breast compression;Adjust position;Support pillows;Position options;Expressed milk;Education;Theme park manager brochure ? ?Discharge ?Pump: Personal ?WIC Program: Yes ? ?Consult Status ?Consult Status: Follow-up ?Date: 10/12/21 ?Follow-up type: In-patient ? ? ? ?Rosalyn Archambault  Nicholson-Springer ?10/11/2021, 4:40 PM ? ? ? ?

## 2021-10-11 NOTE — Anesthesia Procedure Notes (Signed)
Epidural ?Patient location during procedure: OB ?Start time: 10/11/2021 6:25 AM ?End time: 10/11/2021 6:28 AM ? ?Staffing ?Anesthesiologist: Kaylyn Layer, MD ?Performed: anesthesiologist  ? ?Preanesthetic Checklist ?Completed: patient identified, IV checked, risks and benefits discussed, monitors and equipment checked, pre-op evaluation and timeout performed ? ?Epidural ?Patient position: sitting ?Prep: DuraPrep and site prepped and draped ?Patient monitoring: continuous pulse ox, blood pressure and heart rate ?Approach: midline ?Location: L3-L4 ?Injection technique: LOR air ? ?Needle:  ?Needle type: Tuohy  ?Needle gauge: 17 G ?Needle length: 9 cm ?Needle insertion depth: 4 cm ?Catheter type: closed end flexible ?Catheter size: 19 Gauge ?Catheter at skin depth: 9 cm ?Test dose: negative and Other (1% lidocaine) ? ?Assessment ?Events: blood not aspirated, injection not painful, no injection resistance, no paresthesia and negative IV test ? ?Additional Notes ?Patient identified. Risks, benefits, and alternatives discussed with patient including but not limited to bleeding, infection, nerve damage, paralysis, failed block, incomplete pain control, headache, blood pressure changes, nausea, vomiting, reactions to medication, itching, and postpartum back pain. Confirmed with bedside nurse the patient's most recent platelet count. Confirmed with patient that they are not currently taking any anticoagulation, have any bleeding history, or any family history of bleeding disorders. Patient expressed understanding and wished to proceed. All questions were answered. Sterile technique was used throughout the entire procedure. Please see nursing notes for vital signs.  ? ?Crisp LOR on first pass. Test dose was given through epidural catheter and negative prior to continuing to dose epidural or start infusion. Warning signs of high block given to the patient including shortness of breath, tingling/numbness in hands, complete  motor block, or any concerning symptoms with instructions to call for help. Patient was given instructions on fall risk and not to get out of bed. All questions and concerns addressed with instructions to call with any issues or inadequate analgesia.  Reason for block:procedure for pain ? ? ? ?

## 2021-10-11 NOTE — H&P (Signed)
21 y.o. G1P0 @ [redacted]w[redacted]d presents with labor.  Otherwise has good fetal movement and no bleeding.  Since arrival, contractions have spaced.  ? ?Her pregnancy has been uncomplicated to date, with the exception of pre-pregnancy BMI of 16.7.  Growth [place] on 3/8 with  EFW 6lb 11o (39%)  ? ?Past Medical History:  ?Diagnosis Date  ? Anemia   ? Anxiety   ? Asthma   ? exercise induced, pretty much stopped when stopped sports  ? Eczema   ? Migraine   ? Pinched nerve in neck   ? occiputical  ? Urticaria   ?  ?Past Surgical History:  ?Procedure Laterality Date  ? TYMPANOSTOMY TUBE PLACEMENT    ?  ?OB History  ?Gravida Para Term Preterm AB Living  ?1            ?SAB IAB Ectopic Multiple Live Births  ?           ?  ?# Outcome Date GA Lbr Len/2nd Weight Sex Delivery Anes PTL Lv  ?1 Current           ?  ?Social History  ? ?Socioeconomic History  ? Marital status: Single  ?  Spouse name: Not on file  ? Number of children: Not on file  ? Years of education: Not on file  ? Highest education level: Not on file  ?Occupational History  ? Not on file  ?Tobacco Use  ? Smoking status: Never  ? Smokeless tobacco: Never  ? Tobacco comments:  ?  E cig stopped 7/22  ?Vaping Use  ? Vaping Use: Never used  ?Substance and Sexual Activity  ? Alcohol use: No  ? Drug use: No  ? Sexual activity: Yes  ?  Birth control/protection: None  ?Other Topics Concern  ? Not on file  ?Social History Narrative  ? Kensy is a 11th grade at [employer]. She lives with her mother. She enjoys playing her flute, play her ukulele, and enjoys animals.   ?   ? No therapies/counseling  ?   ? No IEP/ 504  ?   ? One Story Home   ? Right Handed  ?   ? Patient [redacted] weeks pregnant with baby girl   ? ?Social Determinants of Health  ? ?Financial Resource Strain: Not on file  ?Food Insecurity: Not on file  ?Transportation Needs: Not on file  ?Physical Activity: Not on file  ?Stress: Not on file  ?Social Connections: Not on file  ?Intimate Partner Violence: Not on file  ? Cherry and  Strawberry extract  ? ? ?Prenatal Transfer Tool  ?Maternal Diabetes: No ?Genetic Screening: Normal ?Maternal Ultrasounds/Referrals: Normal ?Fetal Ultrasounds or other Referrals:  None ?Maternal Substance Abuse:  No ?Significant Maternal Medications:  None ?Significant Maternal Lab Results: Group B Strep positive ? ?ABO, Rh: --/--/A POS (03/23 0530) ?Antibody: NEG (03/23 0530) ?Rubella:  Immune ?RPR:   NR ?HBsAg:   Negative ?HIV:   Negative ?GBS: Positive/-- (03/08 0000)  ? ? ? ?Vitals:  ? 10/11/21 0731 10/11/21 0801  ?BP: (!) 111/47 (!) 89/46  ?Pulse: (!) 106 (!) 105  ?Resp:  (P) 16  ?Temp:    ?SpO2:    ?  ? ?General:  NAD ?Abdomen:  soft, gravid, EFW 6# ?Ex:  no edema ?SVE:  7/90/-1, AROM clear fluid ?FHTs:  130s, moderate variability, category 1 ?Toco:  q8-10 minutes ? ? ?A/P   21 y.o. G1P0 [redacted]w[redacted]d presents with labor ?Contractions have space--now s/p amniotomy.  If no increase in contraction frequency in next 1-2 hours, will start pitocin ?Anticipate SVD ?GBS positive on pcn ? ? ? ?Whitney Mcdonald ? ?

## 2021-10-11 NOTE — Lactation Note (Signed)
This note was copied from a baby's chart. ?Lactation Consultation Note ? ?Patient Name: Whitney Mcdonald ?Today's Date: 10/11/2021 ?Reason for consult: L&D Initial assessment;1st time breastfeeding ?Age:20 hours ? ?P62, 25 years old.  Baby cueing on FOB's chest. ?Assisted with latching on R breast.  After a few attempts, infant coordinated her sucking pattern with sustained latch ,lips flanged. ?Lactation to follow up on MBU.  ? ?Maternal Data ?Does the patient have breastfeeding experience prior to this delivery?: No ? ?LATCH Score ?Latch: Repeated attempts needed to sustain latch, nipple held in mouth throughout feeding, stimulation needed to elicit sucking reflex. ? ?Audible Swallowing: A few with stimulation ? ?Type of Nipple: Everted at rest and after stimulation ? ?Comfort (Breast/Nipple): Soft / non-tender ? ?Hold (Positioning): Assistance needed to correctly position infant at breast and maintain latch. ? ?LATCH Score: 7 ? ? ?Interventions ?Interventions: Assisted with latch;Skin to skin;Support pillows;Education ? ?Consult Status ?Consult Status: Follow-up from L&D ? ? ? ?Dahlia Byes Boschen ?10/11/2021, 2:22 PM ? ? ? ?

## 2021-10-11 NOTE — Anesthesia Preprocedure Evaluation (Signed)
Anesthesia Evaluation  ?Patient identified by MRN, date of birth, ID band ?Patient awake ? ? ? ?Reviewed: ?Allergy & Precautions, Patient's Chart, lab work & pertinent test results ? ?History of Anesthesia Complications ?Negative for: history of anesthetic complications ? ?Airway ?Mallampati: II ? ?TM Distance: >3 FB ?Neck ROM: Full ? ? ? Dental ?no notable dental hx. ? ?  ?Pulmonary ?asthma ,  ?  ?Pulmonary exam normal ? ? ? ? ? ? ? Cardiovascular ?negative cardio ROS ?Normal cardiovascular exam ? ? ?  ?Neuro/Psych ? Headaches, Anxiety   ? GI/Hepatic ?negative GI ROS, Neg liver ROS,   ?Endo/Other  ?negative endocrine ROS ? Renal/GU ?negative Renal ROS  ?negative genitourinary ?  ?Musculoskeletal ?negative musculoskeletal ROS ?(+)  ? Abdominal ?  ?Peds ? Hematology ?negative hematology ROS ?(+)   ?Anesthesia Other Findings ?Day of surgery medications reviewed with patient. ? Reproductive/Obstetrics ?(+) Pregnancy ? ?  ? ? ? ? ? ? ? ? ? ? ? ? ? ?  ?  ? ? ? ? ? ? ? ? ?Anesthesia Physical ?Anesthesia Plan ? ?ASA: 2 ? ?Anesthesia Plan: Epidural  ? ?Post-op Pain Management:   ? ?Induction:  ? ?PONV Risk Score and Plan: Treatment may vary due to age or medical condition ? ?Airway Management Planned: Natural Airway ? ?Additional Equipment: Fetal Monitoring ? ?Intra-op Plan:  ? ?Post-operative Plan:  ? ?Informed Consent: I have reviewed the patients History and Physical, chart, labs and discussed the procedure including the risks, benefits and alternatives for the proposed anesthesia with the patient or authorized representative who has indicated his/her understanding and acceptance.  ? ? ? ? ? ?Plan Discussed with:  ? ?Anesthesia Plan Comments:   ? ? ? ? ? ? ?Anesthesia Quick Evaluation ? ?

## 2021-10-11 NOTE — MAU Note (Signed)
Whitney Mcdonald is a 21 y.o. at [redacted]w[redacted]d here in MAU reporting: ctx every 3 min.  ?LMP:  ?Onset of complaint:  ?Pain score:  ?There were no vitals filed for this visit.   ?FHT: ?Lab orders placed from triage:  ? ?

## 2021-10-12 LAB — CBC
HCT: 27.8 % — ABNORMAL LOW (ref 36.0–46.0)
Hemoglobin: 9.6 g/dL — ABNORMAL LOW (ref 12.0–15.0)
MCH: 29.9 pg (ref 26.0–34.0)
MCHC: 34.5 g/dL (ref 30.0–36.0)
MCV: 86.6 fL (ref 80.0–100.0)
Platelets: 218 10*3/uL (ref 150–400)
RBC: 3.21 MIL/uL — ABNORMAL LOW (ref 3.87–5.11)
RDW: 12.7 % (ref 11.5–15.5)
WBC: 11.4 10*3/uL — ABNORMAL HIGH (ref 4.0–10.5)
nRBC: 0 % (ref 0.0–0.2)

## 2021-10-12 MED ORDER — IBUPROFEN 600 MG PO TABS: 600.0000 mg | ORAL_TABLET | Freq: Four times a day (QID) | ORAL | 1 refills | Status: AC | PRN

## 2021-10-12 NOTE — Discharge Instructions (Signed)
Call office with any concerns [phone] 

## 2021-10-12 NOTE — Progress Notes (Signed)
MOB was referred for history of anxiety. ?* Referral screened out by Clinical Social Worker because none of the following criteria appear to apply: ?~ History of anxiety/depression during this pregnancy, or of post-partum depression following prior delivery. No concerns noted in prenatal records.  ?~ Diagnosis of anxiety and/or depression within last 3 years. Per chart review, MOB's anxiety dates back to 2019.  ?OR ?* MOB's symptoms currently being treated with medication and/or therapy. ?Please contact the Clinical Social Worker if needs arise, by MOB request, or if MOB scores greater than 9/yes to question 10 on Edinburgh Postpartum Depression Screen. ? ?Finnis Colee, LCSW ?Clinical Social Worker ?[facility] ?Cell#: [phone] ? ?

## 2021-10-12 NOTE — Lactation Note (Signed)
This note was copied from a baby's chart. ?Lactation Consultation Note ? ?Patient Name: Whitney Mcdonald ?Today's Date: 10/12/2021 ?Reason for consult: Follow-up assessment;Mother's request;Primapara;1st time breastfeeding;Term;Breastfeeding assistance;Other (Comment) (Anemia) ?Age:20 hours ? ?LC assisted with latching with signs of milk transfer using a tea cup hold in football position to get more depth on the breast.  ? ?Nipples show no signs of trauma. Mom to use EBM for nipple care.  ? ?Plan 1. To feed based on cues 8-12x 24hr period. Mom to use alarm in night to help them wake for feeding, if infant does not stir.  ?2. Mom to latch noting signs of milk transfer.  ?3. I and O sheet provided for home to track feeding, urine and stool output.  ? ?Infant adequate urine and stool output prior to discharge.  ? ?All questions answered at the end of the visit.  ? ?Maternal Data ?Has patient been taught Hand Expression?: Yes ?Does the patient have breastfeeding experience prior to this delivery?: No ? ?Feeding ?Mother's Current Feeding Choice: Breast Milk ? ?LATCH Score ?Latch: Repeated attempts needed to sustain latch, nipple held in mouth throughout feeding, stimulation needed to elicit sucking reflex. ? ?Audible Swallowing: Spontaneous and intermittent ? ?Type of Nipple: Everted at rest and after stimulation ? ?Comfort (Breast/Nipple): Soft / non-tender ? ?Hold (Positioning): Assistance needed to correctly position infant at breast and maintain latch. ? ?LATCH Score: 8 ? ? ?Lactation Tools Discussed/Used ?  ? ?Interventions ?Interventions: Breast feeding basics reviewed;Assisted with latch;Skin to skin;Breast massage;Hand express;Breast compression;Adjust position;Support pillows;Position options;Expressed milk;Education;LC Psychologist, educational;Infant Driven Feeding Algorithm education ? ?Discharge ?Discharge Education: Engorgement and breast care;Warning signs for feeding baby ?WIC Program: Yes ? ?Consult  Status ?Consult Status: Complete ?Date: 10/12/21 ? ? ? ?Whitney Dassow  Mcdonald ?10/12/2021, 4:33 PM ? ? ? ?

## 2021-10-12 NOTE — Anesthesia Postprocedure Evaluation (Signed)
Anesthesia Post Note ? ?Patient: Geryl Schumann ? ?Procedure(s) Performed: AN AD HOC LABOR EPIDURAL ? ?  ? ?Patient location during evaluation: Mother Baby ?Anesthesia Type: Epidural ?Level of consciousness: awake and alert and oriented ?Pain management: satisfactory to patient ?Vital Signs Assessment: post-procedure vital signs reviewed and stable ?Respiratory status: respiratory function stable ?Cardiovascular status: stable ?Postop Assessment: no headache, no backache, epidural receding, patient able to bend at knees, no signs of nausea or vomiting, adequate PO intake and able to ambulate ?Anesthetic complications: no ? ? ?No notable events documented. ? ?Last Vitals:  ?Vitals:  ? 10/12/21 0100 10/12/21 0500  ?BP: 109/60 109/69  ?Pulse: 74 70  ?Resp: 18 16  ?Temp: 36.8 ?C 36.8 ?C  ?SpO2: 100% 100%  ?  ?Last Pain:  ?Vitals:  ? 10/12/21 1427  ?TempSrc:   ?PainSc: 2   ? ?Pain Goal: Patients Stated Pain Goal: 1 (10/12/21 [id number]) ? ?  ?  ?  ?  ?  ?  ?  ? ?Corsica Franson ? ? ? ? ?

## 2021-10-12 NOTE — Progress Notes (Signed)
Post Partum Day 1 ?Subjective: ?no complaints, up ad lib, voiding, tolerating PO, + flatus, and lochia mild - passed one clot only. She is breastfeeding well - bonding with baby. She denies HA, CP or SOB. She would like discharge to home today if possible.   Pain well controlled on meds.  ? ?Objective: ?Blood pressure 109/69, pulse 70, temperature 98.3 ?F (36.8 ?C), temperature source Oral, resp. rate 16, height 5\' 2"  (1.575 m), weight 50.9 kg, last menstrual period 01/06/2021, SpO2 100 %, unknown if currently breastfeeding. ? ?Physical Exam:  ?General: alert, cooperative, and no distress ?Lochia: appropriate ?Uterine Fundus: firm ?Incision: healing well ?DVT Evaluation: No evidence of DVT seen on physical exam. ?Negative Homan's sign. ? ?Recent Labs  ?  10/11/21 ?0530 10/12/21 ?10/14/21  ?HGB 13.1 9.6*  ?HCT 38.0 27.8*  ? ? ?Assessment/Plan: ?Breastfeeding ?Discharge home later today if baby stable for discharge per peds ?Instructions reviewed ?Routine postpartum care till then  ? ? LOS: 1 day  ? ?1610 ?10/12/2021, 11:13 AM  ? ? ?

## 2021-10-12 NOTE — Discharge Summary (Signed)
? ?  Postpartum Discharge Summary ? ?Date of Service updated  ? ?   ?Patient Name: Whitney Mcdonald ?DOB: April 10, 2001 ?MRN: [id number] ? ?Date of admission: 10/11/2021 ?Delivery date:10/11/2021  ?Delivering provider: Marlow Baars  ?Date of discharge: 10/12/2021 ? ?Admitting diagnosis: Normal labor [O80, Z37.9] ?Intrauterine pregnancy: [redacted]w[redacted]d     ?Secondary diagnosis:  Principal Problem: ?  Normal labor ? ?Additional problems: none    ?Discharge diagnosis: Term Pregnancy Delivered                                              ?Post partum procedures: n/a ?Augmentation: AROM and Pitocin ?Complications: None ? ?Hospital course: Onset of Labor With Vaginal Delivery      ?21 y.o. yo G1P1001 at [redacted]w[redacted]d was admitted in Latent Labor on 10/11/2021. Patient had an uncomplicated labor course as follows:  ?Membrane Rupture Time/Date: 8:03 AM ,10/11/2021   ?Delivery Method:Vaginal, Vacuum (Extractor)  ?Episiotomy: Median  ?Lacerations:  Vaginal  ?Patient had an uncomplicated postpartum course.  She is ambulating, tolerating a regular diet, passing flatus, and urinating well. Patient is discharged home in stable condition on 10/12/21. ? ?Newborn Data: ?Birth date:10/11/2021  ?Birth time:1:35 PM  ?Gender:Female  ?Living status:Living  ?Apgars:9 ,10  ?Weight:3305 g  ? ?Magnesium Sulfate received: No ?BMZ received: No ? ?Physical exam  ?Vitals:  ? 10/11/21 1652 10/11/21 2050 10/12/21 0100 10/12/21 0500  ?BP: 113/65 138/90 109/60 109/69  ?Pulse: 90 79 74 70  ?Resp: 18 18 18 16   ?Temp: 97.6 ?F (36.4 ?C) 98.1 ?F (36.7 ?C) 98.2 ?F (36.8 ?C) 98.3 ?F (36.8 ?C)  ?TempSrc: Oral Oral Oral Oral  ?SpO2: 100% 100% 100% 100%  ?Weight:      ?Height:      ? ?General: alert, cooperative, and no distress ?Lochia: appropriate ?Uterine Fundus: firm ?Incision: N/A ?DVT Evaluation: No evidence of DVT seen on physical exam. ?Labs: ?Lab Results  ?Component Value Date  ? WBC 11.4 (H) 10/12/2021  ? HGB 9.6 (L) 10/12/2021  ? HCT 27.8 (L) 10/12/2021  ? MCV 86.6  10/12/2021  ? PLT 218 10/12/2021  ? ?   ? View : No data to display.  ?  ?  ?  ? ?Edinburgh Score: ? ?  10/11/2021  ?  5:00 PM  ?Edinburgh Postnatal Depression Scale Screening Tool  ?I have been able to laugh and see the funny side of things. 0  ?I have looked forward with enjoyment to things. 0  ?I have blamed myself unnecessarily when things went wrong. 0  ?I have been anxious or worried for no good reason. 0  ?I have felt scared or panicky for no good reason. 0  ?Things have been getting on top of me. 0  ?I have been so unhappy that I have had difficulty sleeping. 0  ?I have felt sad or miserable. 0  ?I have been so unhappy that I have been crying. 0  ?The thought of harming myself has occurred to me. 0  ?Edinburgh Postnatal Depression Scale Total 0  ? ? ? ? ?After visit meds:  ?Allergies as of 10/12/2021   ? ?   Reactions  ? Cherry   ? Mouth itching  ? Strawberry Extract   ? Used to be a rash, rec'd allergy shots  ? ?  ? ?  ?Medication List  ?  ? ?STOP taking these medications   ? ?  BABY ASPIRIN PO ?  ? ?  ? ?TAKE these medications   ? ?albuterol 108 (90 Base) MCG/ACT inhaler ?Commonly known as: ProAir HFA ?Inhale 2 puffs every 4 (four) hours as needed into the lungs for wheezing or shortness of breath. ?  ?EPINEPHrine 0.3 mg/0.3 mL Soaj injection ?Commonly known as: EpiPen 2-Pak ?Use as directed for severe allergic reaction ?  ?fluticasone 50 MCG/ACT nasal spray ?Commonly known as: FLONASE ?2 sprays per nostril once a day if needed ?  ?ibuprofen 600 MG tablet ?Commonly known as: ADVIL ?Take 1 tablet (600 mg total) by mouth every 6 (six) hours as needed for moderate pain or cramping. ?  ?montelukast 10 MG tablet ?Commonly known as: SINGULAIR ?TAKE 1 TABLET (10 MG TOTAL) AT BEDTIME BY MOUTH. ?  ?PRENATAL COMPLETE PO ?Take by mouth. ?  ? ?  ? ? ? ?Discharge home in stable condition ?Infant Feeding: Breast ?Infant Disposition:home with mother ?Discharge instruction: per After Visit Summary and Postpartum  booklet. ?Activity: Advance as tolerated. Pelvic rest for 6 weeks.  ?Diet: routine diet ?Anticipated Birth Control: Unsure ?Postpartum Appointment:6 weeks ?Additional Postpartum F/U: Postpartum Depression checkup ?Future Appointments: ?Future Appointments  ?Date Time Provider Department Center  ?11/13/2021  2:10 PM Shon Millet R, DO [facility] None  ? ?Follow up Visit: ? Follow-up Information   ? ? Ob/Gyn, Nestor Ramp. Schedule an appointment as soon as possible for a visit in 6 week(s).   ?Why: For postpartum visit ?Contact information: ?[address] ?[address] ?[place] [address] ?[phone] ? ? ?  ?  ? ?  ?  ? ?  ? ? ? ?  ? ?10/12/2021 ?Cathrine Muster, DO ? ? ?

## 2021-10-15 ENCOUNTER — Inpatient Hospital Stay (HOSPITAL_COMMUNITY): Payer: Medicaid Other

## 2021-10-15 ENCOUNTER — Inpatient Hospital Stay (HOSPITAL_COMMUNITY)
Admission: AD | Admit: 2021-10-15 | Payer: Medicaid Other | Source: Home / Self Care | Admitting: Obstetrics and Gynecology

## 2021-10-23 ENCOUNTER — Telehealth (HOSPITAL_COMMUNITY): Payer: Self-pay | Admitting: *Deleted

## 2021-10-23 NOTE — Telephone Encounter (Signed)
Attempted hospital discharge follow-up call. Left message for patient to return RN call. Erline Levine, RN, 10/23/21, 1555 ?

## 2021-11-12 NOTE — Progress Notes (Deleted)
NEUROLOGY FOLLOW UP OFFICE NOTE  Whitney Mcdonald [id number]  Assessment/Plan:   Migraine without aura, without status migrainosus, not intractable Right sided occipital neuralgia History of multiple concussions Pregnant at [redacted] weeks gestation     1  She may start magnesium oxide 400mg  and riboflavin 400mg  daily.  She does plan to breastfeed, at which time we can start propranolol if needed. 2  For now, she may take acetaminophen if needed (Limit use of pain relievers to no more than 2 days out of week to prevent risk of rebound or medication-overuse headache).  Once her baby is born, we can try sumatriptan but she will need to hold nursing for 8 to 12 hours. 3.  Headache diary 4.  Follow up in 3 months.     Subjective:  Whitney Mcdonald is a 22 year old female who follows up for migraines.   UPDATE: Delivered her baby on 10/11/2021 via NSVD.  No complications.  *** Intensity:  *** Duration:  *** Frequency:  *** Frequency of abortive medication: *** Current NSAIDS/analgesics:  asa 81mg  Current triptans:  none Current ergotamine:  none Current anti-emetic:  none Current muscle relaxants:  none Current Antihypertensive medications:  none Current Antidepressant medications:  none Current Anticonvulsant medications:  none Current anti-CGRP:  none Current Vitamins/Herbal/Supplements:  prenatal Current Antihistamines/Decongestants:  Flonase Other therapy:  none Hormone/birth control:  none  Caffeine:  *** Alcohol:  *** Smoker:  *** Diet:  *** Exercise:  *** Depression:  ***; Anxiety:  *** Other pain:  *** Sleep hygiene:  ***  HISTORY: History of migraines since 59 or 21 years old.  At that time, they were frequent.  They have improved since then.  They are a 7/10 throbbing/pounding to pressure pain in band like distribution or unilateral.  Sometimes associated with a shooting pain radiating up from the right occipital region.  Associated photophobia, phonophobia,  visual disturbance (black spots) and dizziness but denies nausea, vomiting, unilateral numbness or weakness.  They typically last 2 hours but may last all day.  They occur 2 to 3 times a week.  If she sleeps in an awkward position, causing neck strain, she may have a severe migraine that following day.  Sleep helps relieve them.  Usually responds to Excedrin Migraine.  Previously on propranolol which was helpful.      She has history of multiple head injuries since childhood.  She briefly lost consciousness twice.  Usually due to being clumsy (running backwards and falling, falling off a 7 foot wall).  On one occasion she had sustained some mild intracranial bleeding requiring no intervention.  She most recently had a concussion in October 2022 when a bed frame leaning against the wall fell on her head.  No LOC.  She was dizzy for a couple of days.   CT head on 08/09/2017 personally reviewed was normal.      Past NSAIDS/analgesics:  Excedrin Migraine Past abortive triptans:  none Past abortive ergotamine:  none Past muscle relaxants:  cyclobenzaprine Past anti-emetic:  none Past antihypertensive medications:  propranlol Past antidepressant medications:  none Past anticonvulsant medications:  gabapentin Past anti-CGRP:  none Past vitamins/Herbal/Supplements:  none Past antihistamines/decongestants:  Claritin, Flonase Other past therapies:  none   Family history:  Mom has migraines.  PAST MEDICAL HISTORY: Past Medical History:  Diagnosis Date   Anemia    Anxiety    Asthma    exercise induced, pretty much stopped when stopped sports   Eczema  Migraine    Pinched nerve in neck    occiputical   Urticaria     MEDICATIONS: Current Outpatient Medications on File Prior to Visit  Medication Sig Dispense Refill   albuterol (PROAIR HFA) 108 (90 Base) MCG/ACT inhaler Inhale 2 puffs every 4 (four) hours as needed into the lungs for wheezing or shortness of breath. (Patient not taking:  Reported on 08/15/2021) 1 Inhaler 2   EPINEPHrine (EPIPEN 2-PAK) 0.3 mg/0.3 mL IJ SOAJ injection Use as directed for severe allergic reaction 2 Device 1   fluticasone (FLONASE) 50 MCG/ACT nasal spray 2 sprays per nostril once a day if needed 16 g 5   ibuprofen (ADVIL) 600 MG tablet Take 1 tablet (600 mg total) by mouth every 6 (six) hours as needed for moderate pain or cramping. 40 tablet 1   montelukast (SINGULAIR) 10 MG tablet TAKE 1 TABLET (10 MG TOTAL) AT BEDTIME BY MOUTH. 30 tablet 0   Prenatal Vit-Fe Fumarate-FA (PRENATAL COMPLETE PO) Take by mouth.     No current facility-administered medications on file prior to visit.    ALLERGIES: Allergies  Allergen Reactions   Cherry     Mouth itching   Strawberry Extract     Used to be a rash, rec'd allergy shots    FAMILY HISTORY: Family History  Problem Relation Age of Onset   Diabetes Mother    Heart disease Mother        murmur   Allergic rhinitis Mother    Asthma Mother    Sinusitis Mother    Depression Mother    Bipolar disorder Mother    Healthy Father    Eczema Neg Hx    Immunodeficiency Neg Hx    Urticaria Neg Hx    Migraines Neg Hx    Seizures Neg Hx    Anxiety disorder Neg Hx    Schizophrenia Neg Hx    ADD / ADHD Neg Hx    Autism Neg Hx       Objective:  *** General: No acute distress.  Patient appears ***-groomed.   Head:  Normocephalic/atraumatic Eyes:  Fundi examined but not visualized Neck: supple, no paraspinal tenderness, full range of motion Heart:  Regular rate and rhythm Lungs:  Clear to auscultation bilaterally Back: No paraspinal tenderness Neurological Exam: alert and oriented to person, place, and time.  Speech fluent and not dysarthric, language intact.  CN II-XII intact. Bulk and tone normal, muscle strength 5/5 throughout.  Sensation to light touch intact.  Deep tendon reflexes 2+ throughout, toes downgoing.  Finger to nose testing intact.  Gait normal, Romberg negative.   Shon Millet,  DO  CC: ***

## 2021-11-13 ENCOUNTER — Encounter: Payer: Self-pay | Admitting: Neurology

## 2021-11-13 ENCOUNTER — Ambulatory Visit: Payer: Medicaid Other | Admitting: Neurology

## 2021-11-13 DIAGNOSIS — Z029 Encounter for administrative examinations, unspecified: Secondary | ICD-10-CM
